# Patient Record
Sex: Female | Born: 2005 | Race: Black or African American | Hispanic: No | Marital: Single | Smoking: Never smoker
Health system: Southern US, Community
[De-identification: ages and names within clinical notes are randomized; demographics above are authoritative.]

---
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
## 2024-01-06 ENCOUNTER — Other Ambulatory Visit

## 2024-02-03 ENCOUNTER — Inpatient Hospital Stay: Admission: RE | Admit: 2024-02-03 | Discharge: 2024-02-03 | Attending: Family Medicine

## 2024-02-03 ENCOUNTER — Other Ambulatory Visit: Payer: Self-pay | Admitting: Family Medicine

## 2024-02-03 DIAGNOSIS — N6325 Unspecified lump in the left breast, overlapping quadrants: Secondary | ICD-10-CM | POA: Diagnosis not present

## 2024-02-03 DIAGNOSIS — R928 Other abnormal and inconclusive findings on diagnostic imaging of breast: Secondary | ICD-10-CM

## 2024-02-03 DIAGNOSIS — N6342 Unspecified lump in left breast, subareolar: Secondary | ICD-10-CM

## 2024-02-07 ENCOUNTER — Ambulatory Visit: Payer: Self-pay | Admitting: Student

## 2024-02-07 ENCOUNTER — Encounter: Payer: Self-pay | Admitting: Family Medicine

## 2024-02-07 DIAGNOSIS — N63 Unspecified lump in unspecified breast: Secondary | ICD-10-CM | POA: Insufficient documentation

## 2024-02-07 NOTE — Telephone Encounter (Signed)
 Result letter routed to staff.   Damien Pinal, DO [facility], PGY-3 02/07/2024 1:19 PM
